# Patient Record
Sex: Male | Born: 1990
Health system: Southern US, Community
[De-identification: ages and names within clinical notes are randomized; demographics above are authoritative.]

## PROBLEM LIST (undated history)

## (undated) DIAGNOSIS — I10 Essential (primary) hypertension: Secondary | ICD-10-CM

## (undated) HISTORY — PX: WISDOM TOOTH EXTRACTION: SHX21

## (undated) HISTORY — PX: TONSILLECTOMY: SUR1361

---
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
## 2019-08-02 ENCOUNTER — Ambulatory Visit: Payer: Self-pay | Attending: Internal Medicine

## 2019-08-02 DIAGNOSIS — U071 COVID-19: Secondary | ICD-10-CM | POA: Insufficient documentation

## 2019-08-02 DIAGNOSIS — Z20822 Contact with and (suspected) exposure to covid-19: Secondary | ICD-10-CM

## 2019-08-04 ENCOUNTER — Other Ambulatory Visit: Payer: Self-pay

## 2019-08-04 LAB — NOVEL CORONAVIRUS, NAA: SARS-CoV-2, NAA: DETECTED — AB

## 2019-08-15 ENCOUNTER — Emergency Department: Payer: BC Managed Care – PPO

## 2019-08-15 ENCOUNTER — Emergency Department
Admission: EM | Admit: 2019-08-15 | Discharge: 2019-08-15 | Disposition: A | Discharge status: Home / Self Care | Payer: BC Managed Care – PPO | Attending: Emergency Medicine | Admitting: Emergency Medicine

## 2019-08-15 ENCOUNTER — Other Ambulatory Visit: Payer: Self-pay

## 2019-08-15 ENCOUNTER — Encounter: Payer: Self-pay | Admitting: Emergency Medicine

## 2019-08-15 DIAGNOSIS — R0789 Other chest pain: Secondary | ICD-10-CM | POA: Diagnosis not present

## 2019-08-15 DIAGNOSIS — R079 Chest pain, unspecified: Secondary | ICD-10-CM

## 2019-08-15 DIAGNOSIS — R000 Tachycardia, unspecified: Secondary | ICD-10-CM | POA: Diagnosis present

## 2019-08-15 DIAGNOSIS — I10 Essential (primary) hypertension: Secondary | ICD-10-CM | POA: Insufficient documentation

## 2019-08-15 DIAGNOSIS — F17210 Nicotine dependence, cigarettes, uncomplicated: Secondary | ICD-10-CM | POA: Insufficient documentation

## 2019-08-15 LAB — BASIC METABOLIC PANEL
Anion gap: 13 (ref 5–15)
BUN: 9 mg/dL (ref 6–20)
CO2: 22 mmol/L (ref 22–32)
Calcium: 9.4 mg/dL (ref 8.9–10.3)
Chloride: 100 mmol/L (ref 98–111)
Creatinine, Ser: 1.05 mg/dL (ref 0.61–1.24)
GFR calc Af Amer: >60 mL/min (ref >60)
GFR calc non Af Amer: >60 mL/min (ref >60)
Glucose, Bld: 104 mg/dL — ABNORMAL HIGH (ref 70–99)
Potassium: 3.6 mmol/L (ref 3.5–5.1)
Sodium: 135 mmol/L (ref 135–145)

## 2019-08-15 LAB — CBC
HCT: 45.6 % (ref 39.0–52.0)
Hemoglobin: 16.6 g/dL (ref 13.0–17.0)
MCH: 30.5 pg (ref 26.0–34.0)
MCHC: 36.4 g/dL — ABNORMAL HIGH (ref 30.0–36.0)
MCV: 83.7 fL (ref 80.0–100.0)
Platelets: 279 10*3/uL (ref 150–400)
RBC: 5.45 MIL/uL (ref 4.22–5.81)
RDW: 11.1 % — ABNORMAL LOW (ref 11.5–15.5)
WBC: 11.1 10*3/uL — ABNORMAL HIGH (ref 4.0–10.5)
nRBC: 0 % (ref 0.0–0.2)

## 2019-08-15 LAB — FIBRIN DERIVATIVES D-DIMER (ARMC ONLY): Fibrin derivatives D-dimer (ARMC): 240.57 ng/mL (FEU) (ref 0.00–499.00)

## 2019-08-15 LAB — TROPONIN I (HIGH SENSITIVITY)
Troponin I (High Sensitivity): <2 ng/L (ref <18)
Troponin I (High Sensitivity): <2 ng/L (ref <18)

## 2019-08-15 MED ORDER — LORAZEPAM 0.5 MG PO TABS: 0.5000 mg | ORAL_TABLET | Freq: Three times a day (TID) | ORAL | 0 refills | Status: AC | PRN

## 2019-08-15 MED ORDER — LORAZEPAM 2 MG/ML IJ SOLN
1.0000 mg | Freq: Once | INTRAMUSCULAR | Status: AC
  Administered 2019-08-15: 1 mg via INTRAVENOUS
  Filled 2019-08-15: qty 1

## 2019-08-15 MED ORDER — SODIUM CHLORIDE 0.9 % IV BOLUS
500.0000 mL | Freq: Once | INTRAVENOUS | Status: AC
  Administered 2019-08-15: 500 mL via INTRAVENOUS

## 2019-08-15 MED ORDER — LORAZEPAM 0.5 MG PO TABS: 0.5000 mg | ORAL_TABLET | Freq: Three times a day (TID) | ORAL | 0 refills | Status: DC | PRN

## 2019-08-15 MED ORDER — SODIUM CHLORIDE 0.9% FLUSH
3.0000 mL | Freq: Once | INTRAVENOUS | Status: AC
  Administered 2019-08-15: 3 mL via INTRAVENOUS

## 2019-08-15 NOTE — Discharge Instructions (Addendum)
Please seek medical attention for any high fevers, chest pain, shortness of breath, change in behavior, persistent vomiting, bloody stool or any other new or concerning symptoms.  

## 2019-08-15 NOTE — ED Triage Notes (Signed)
Pt presents to ED via POV with c/o CP that is tight in nature, Pt states tightness radiates up to his jaw. Pt noted to be tachycardic in triage with a rate of 140. Pt states CP starts in the middle then radiates to entire chest. Pt states tested positive for Covid 1/1.

## 2019-08-15 NOTE — ED Provider Notes (Signed)
[facility] Emergency Department Provider Note  ____________________________________________   I have reviewed the triage vital signs and the nursing notes.   HISTORY  Chief Complaint Tachycardia and Chest Pain   History limited by: Not Limited   HPI Kenneth Sawyer is a 29 y.o. male who presents to the emergency department today because of concern for high blood pressure and fast heart rate. The patient states that he felt like he had high blood pressure because he could feel his heart beating heavily and pulsations in his neck. The patient states that he has had some associated chest tightness with these feelings. Additionally has noticed some numbness in the arms or legs. The patient was diagnosed with covid a little over 2 weeks ago. States that he never had fevers, shortness of breath, cough or nausea with his illness. Was given a course of steroids and antibiotics which he has finished.    Records reviewed. Per medical record review patient has a history of palpitations, seen by cardiology roughly 4 years ago.   History reviewed. No pertinent past medical history.  There are no problems to display for this patient.   Past Surgical History:  Procedure Laterality Date  . TONSILLECTOMY    . WISDOM TOOTH EXTRACTION      Prior to Admission medications   Not on File    Allergies Patient has no known allergies.  No family history on file.  Social History Social History   Tobacco Use  . Smoking status: Not on file  . Smokeless tobacco: Current User  Substance Use Topics  . Alcohol use: Yes  . Drug use: Never    Review of Systems Constitutional: No fever/chills Eyes: No visual changes. ENT: No sore throat. Cardiovascular: Positive for chest tightness and fast heart rate.  Respiratory: Denies shortness of breath. Gastrointestinal: No abdominal pain.  No nausea, no vomiting.  No diarrhea.   Genitourinary: Negative for  dysuria. Musculoskeletal: Negative for back pain. Skin: Negative for rash. Neurological: Positive for extremity numbness. ____________________________________________   PHYSICAL EXAM:  VITAL SIGNS: ED Triage Vitals  Enc Vitals Group     BP 08/15/19 1922 (!) 147/113     Pulse Rate 08/15/19 1922 (!) 140     Resp 08/15/19 1922 17     Temp 08/15/19 1922 98.2 F (36.8 C)     Temp Source 08/15/19 1922 Oral     SpO2 08/15/19 1922 99 %     Weight 08/15/19 1922 240 lb (108.9 kg)     Height 08/15/19 1922 6\' 1"  (1.854 m)     Head Circumference --      Peak Flow --      Pain Score 08/15/19 1941 7   Constitutional: Alert and oriented.  Eyes: Conjunctivae are normal.  ENT      Head: Normocephalic and atraumatic.      Nose: No congestion/rhinnorhea.      Mouth/Throat: Mucous membranes are moist.      Neck: No stridor. Hematological/Lymphatic/Immunilogical: No cervical lymphadenopathy. Cardiovascular: Tachycardic, regular rhythm.  No murmurs, rubs, or gallops.  Respiratory: Normal respiratory effort without tachypnea nor retractions. Breath sounds are clear and equal bilaterally. No wheezes/rales/rhonchi. Gastrointestinal: Soft and non tender. No rebound. No guarding.  Genitourinary: Deferred Musculoskeletal: Normal range of motion in all extremities. No lower extremity edema. Neurologic:  Normal speech and language. No gross focal neurologic deficits are appreciated.  Skin:  Skin is warm, dry and intact. No rash noted. Psychiatric: Mood and affect are normal.  Speech and behavior are normal. Patient exhibits appropriate insight and judgment.  ____________________________________________    LABS (pertinent positives/negatives)  CBC wbc 11.1, hgb 16.6, plt 279 BMP wnl except glu 104 Trop hs <2 x 2 D-dimer 240.57 ____________________________________________   EKG  I, Phineas Semen, attending physician, personally viewed and interpreted this EKG  EKG Time: 1924 Rate:  128 Rhythm: sinus tachycardia Axis: normal Intervals: qtc 443 QRS: narrow ST changes: no st elevation Impression: abnormal ekg  ____________________________________________    RADIOLOGY  CXR No acute abnormality ____________________________________________   PROCEDURES  Procedures  ____________________________________________   INITIAL IMPRESSION / ASSESSMENT AND PLAN / ED COURSE  Pertinent labs & imaging results that were available during my care of the patient were reviewed by me and considered in my medical decision making (see chart for details).   Patient presented to the emergency department today because of concerns for elevated blood pressure as well as fast heart rate.  Patient's work-up here with negative troponin x2.  D-dimer was negative.  Patient did feel some improvement after Ativan.  Both blood pressure and heart rate improved.  Patient clinically felt better.  Did have a discussion with the patient.  At this point I do wonder if anxiety is playing some role in the patient's symptoms.  Will plan on discharging with prescription for Ativan.  Discussed with patient however importance of follow-up with both cardiology and primary care.   ____________________________________________   FINAL CLINICAL IMPRESSION(S) / ED DIAGNOSES  Final diagnoses:  Tachycardia  Hypertension, unspecified type  Atypical chest pain     Note: This dictation was prepared with Dragon dictation. Any transcriptional errors that result from this process are unintentional     Phineas Semen, MD 08/15/19 2344

## 2019-08-15 NOTE — ED Notes (Signed)
ED Provider at bedside. 

## 2019-08-15 NOTE — ED Notes (Signed)
Pt stating he still feels the same with little relief.

## 2020-02-28 ENCOUNTER — Other Ambulatory Visit: Payer: Self-pay | Admitting: Internal Medicine

## 2020-02-28 ENCOUNTER — Other Ambulatory Visit (HOSPITAL_COMMUNITY): Payer: Self-pay | Admitting: Internal Medicine

## 2020-02-28 DIAGNOSIS — R140 Abdominal distension (gaseous): Secondary | ICD-10-CM

## 2020-02-29 ENCOUNTER — Ambulatory Visit: Payer: 59

## 2020-03-02 ENCOUNTER — Ambulatory Visit
Admission: RE | Admit: 2020-03-02 | Discharge: 2020-03-02 | Disposition: A | Discharge status: Home / Self Care | Payer: 59 | Source: Ambulatory Visit | Attending: Internal Medicine | Admitting: Internal Medicine

## 2020-03-02 ENCOUNTER — Other Ambulatory Visit: Payer: Self-pay

## 2020-03-02 DIAGNOSIS — R140 Abdominal distension (gaseous): Secondary | ICD-10-CM

## 2020-03-02 HISTORY — DX: Essential (primary) hypertension: I10

## 2020-03-02 MED ORDER — IOHEXOL 300 MG/ML  SOLN
100.0000 mL | Freq: Once | INTRAMUSCULAR | Status: AC | PRN
  Administered 2020-03-02: 100 mL via INTRAVENOUS

## 2021-01-10 IMAGING — DX DG CHEST 1V PORT
1 series · 1 of 1 positions shown · non-contrast
Comparison: None.

CLINICAL DATA: Chest pain and tightness

EXAM:
PORTABLE CHEST 1 VIEW

[chest ap]
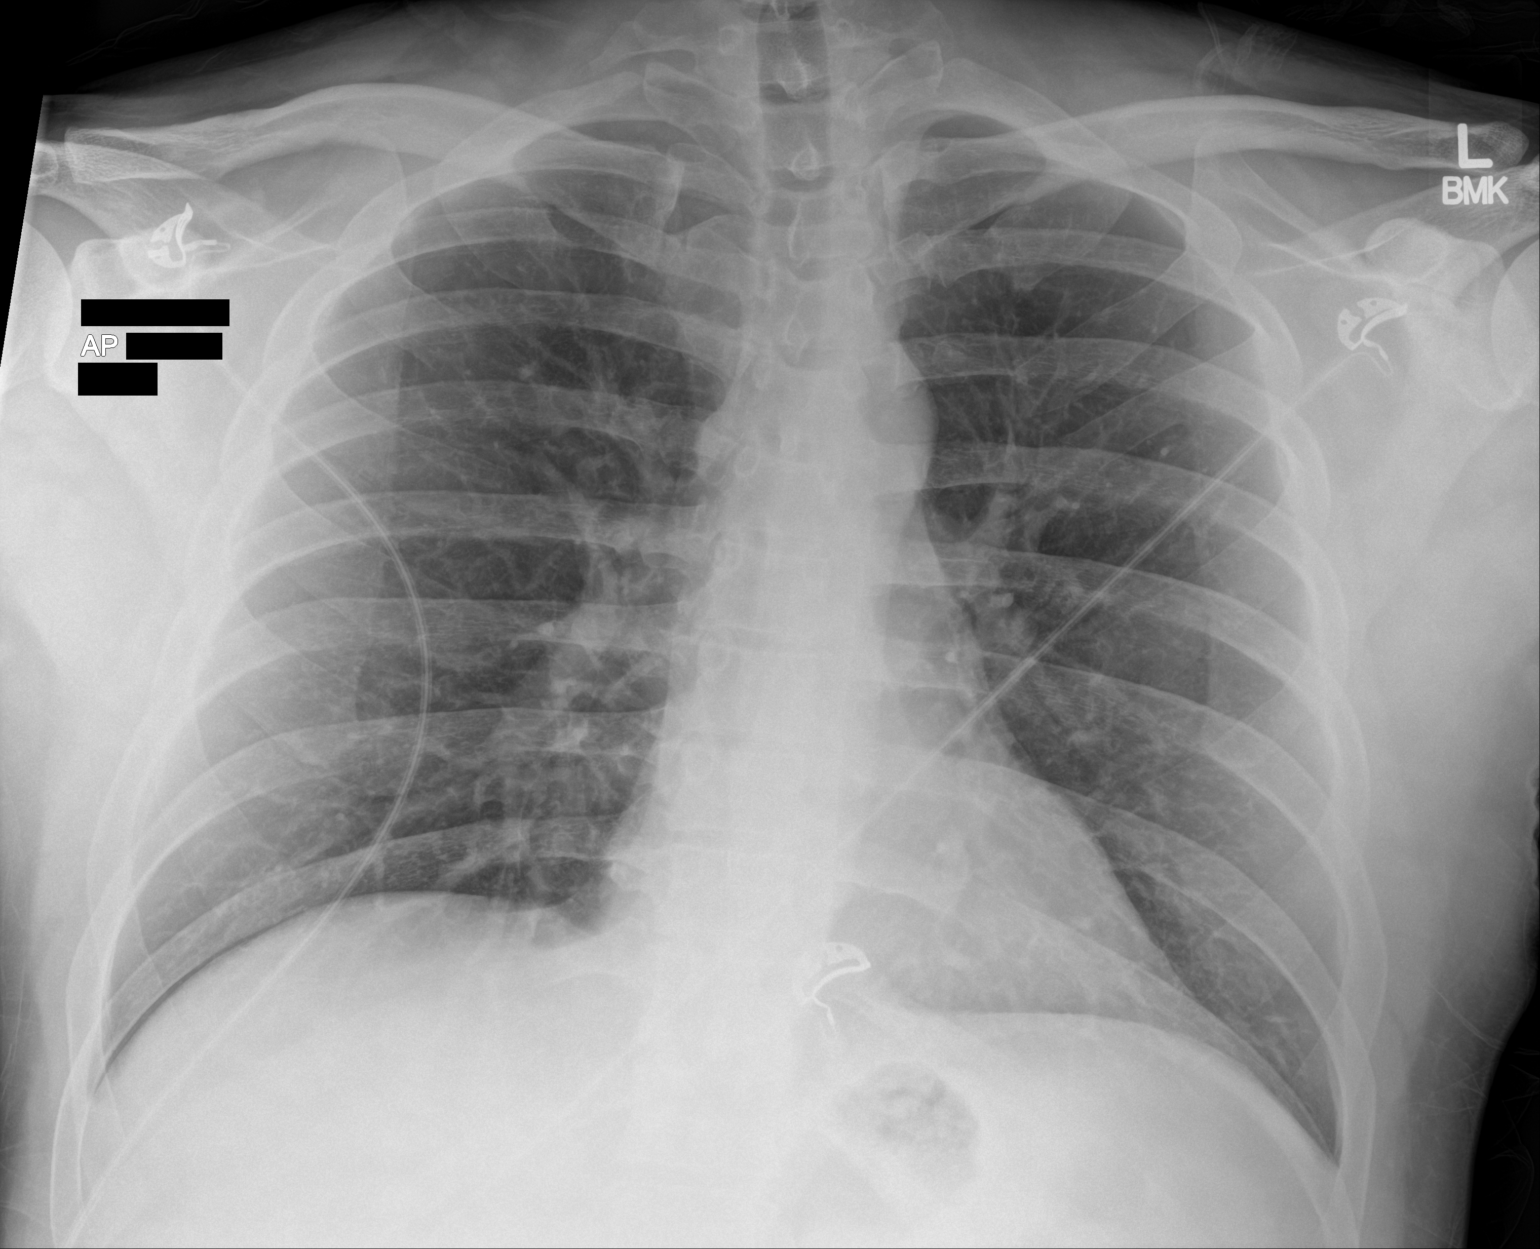

[1 of 1 positions shown; findings below may reference images not displayed]

FINDINGS: The heart size and mediastinal contours are within normal limits.
Both lungs are clear. The visualized skeletal structures are
unremarkable.
IMPRESSION: No active disease.

## 2021-01-16 ENCOUNTER — Emergency Department (HOSPITAL_COMMUNITY): Payer: 59

## 2021-01-16 ENCOUNTER — Other Ambulatory Visit: Payer: Self-pay

## 2021-01-16 ENCOUNTER — Emergency Department (HOSPITAL_COMMUNITY)
Admission: EM | Admit: 2021-01-16 | Discharge: 2021-01-16 | Disposition: A | Discharge status: Home / Self Care | Payer: 59 | Attending: Emergency Medicine | Admitting: Emergency Medicine

## 2021-01-16 ENCOUNTER — Encounter (HOSPITAL_COMMUNITY): Payer: Self-pay | Admitting: Emergency Medicine

## 2021-01-16 DIAGNOSIS — F17290 Nicotine dependence, other tobacco product, uncomplicated: Secondary | ICD-10-CM | POA: Diagnosis not present

## 2021-01-16 DIAGNOSIS — I10 Essential (primary) hypertension: Secondary | ICD-10-CM | POA: Insufficient documentation

## 2021-01-16 DIAGNOSIS — M25561 Pain in right knee: Secondary | ICD-10-CM | POA: Diagnosis present

## 2021-01-16 DIAGNOSIS — Z79899 Other long term (current) drug therapy: Secondary | ICD-10-CM | POA: Diagnosis not present

## 2021-01-16 DIAGNOSIS — L03115 Cellulitis of right lower limb: Secondary | ICD-10-CM | POA: Diagnosis not present

## 2021-01-16 LAB — BASIC METABOLIC PANEL
Anion gap: 7 (ref 5–15)
Anion gap: 8 (ref 5–15)
BUN: 11 mg/dL (ref 6–20)
BUN: 14 mg/dL (ref 6–20)
CO2: 18 mmol/L — ABNORMAL LOW (ref 22–32)
CO2: 24 mmol/L (ref 22–32)
Calcium: 6.7 mg/dL — ABNORMAL LOW (ref 8.9–10.3)
Calcium: 8.9 mg/dL (ref 8.9–10.3)
Chloride: 113 mmol/L — ABNORMAL HIGH (ref 98–111)
Chloride: 103 mmol/L (ref 98–111)
Creatinine, Ser: 0.76 mg/dL (ref 0.61–1.24)
Creatinine, Ser: 1.15 mg/dL (ref 0.61–1.24)
GFR, Estimated: >60 mL/min (ref >60)
GFR, Estimated: >60 mL/min (ref >60)
Glucose, Bld: 69 mg/dL — ABNORMAL LOW (ref 70–99)
Glucose, Bld: 96 mg/dL (ref 70–99)
Potassium: 2.6 mmol/L — CL (ref 3.5–5.1)
Potassium: 3.7 mmol/L (ref 3.5–5.1)
Sodium: 138 mmol/L (ref 135–145)
Sodium: 135 mmol/L (ref 135–145)

## 2021-01-16 LAB — CBC WITH DIFFERENTIAL/PLATELET
Abs Immature Granulocytes: 0.03 10*3/uL (ref 0.00–0.07)
Basophils Absolute: 0 10*3/uL (ref 0.0–0.1)
Basophils Relative: 0 %
Eosinophils Absolute: 0.2 10*3/uL (ref 0.0–0.5)
Eosinophils Relative: 2 %
HCT: 46.9 % (ref 39.0–52.0)
Hemoglobin: 16.6 g/dL (ref 13.0–17.0)
Immature Granulocytes: 0 %
Lymphocytes Relative: 22 %
Lymphs Abs: 1.8 10*3/uL (ref 0.7–4.0)
MCH: 31.6 pg (ref 26.0–34.0)
MCHC: 35.4 g/dL (ref 30.0–36.0)
MCV: 89.3 fL (ref 80.0–100.0)
Monocytes Absolute: 0.7 10*3/uL (ref 0.1–1.0)
Monocytes Relative: 9 %
Neutro Abs: 5.3 10*3/uL (ref 1.7–7.7)
Neutrophils Relative %: 67 %
Platelets: 270 10*3/uL (ref 150–400)
RBC: 5.25 MIL/uL (ref 4.22–5.81)
RDW: 11.5 % (ref 11.5–15.5)
WBC: 8.1 10*3/uL (ref 4.0–10.5)
nRBC: 0 % (ref 0.0–0.2)

## 2021-01-16 MED ORDER — SODIUM CHLORIDE 0.9 % IV BOLUS
1000.0000 mL | Freq: Once | INTRAVENOUS | Status: AC
  Administered 2021-01-16: 1000 mL via INTRAVENOUS

## 2021-01-16 MED ORDER — SULFAMETHOXAZOLE-TRIMETHOPRIM 800-160 MG PO TABS: 1.0000 | ORAL_TABLET | Freq: Two times a day (BID) | ORAL | 0 refills | Status: AC

## 2021-01-16 MED ORDER — SODIUM CHLORIDE 0.9 % IV SOLN
1.0000 g | Freq: Once | INTRAVENOUS | Status: AC
  Administered 2021-01-16: 1 g via INTRAVENOUS
  Filled 2021-01-16: qty 10

## 2021-01-16 MED ORDER — POTASSIUM CHLORIDE CRYS ER 20 MEQ PO TBCR
40.0000 meq | EXTENDED_RELEASE_TABLET | Freq: Once | ORAL | Status: AC
  Administered 2021-01-16: 40 meq via ORAL
  Filled 2021-01-16: qty 2

## 2021-01-16 NOTE — ED Provider Notes (Signed)
[facility] EMERGENCY DEPARTMENT Provider Note   CSN: [id number] Arrival date & time: 01/16/21  1839     History Chief Complaint  Patient presents with   Knee Pain    Kenneth Sawyer is a 30 y.o. male.  The history is provided by the patient. No language interpreter was used.  Knee Pain Location:  Knee Time since incident:  1 day Injury: no   Knee location:  R knee Pain details:    Quality:  Aching   Radiates to:  Does not radiate   Severity:  Moderate   Onset quality:  Gradual   Timing:  Constant   Progression:  Worsening Dislocation: no   Tetanus status:  Up to date Relieved by:  Nothing Worsened by:  Nothing Ineffective treatments:  None tried Associated symptoms: no fever   Pt complains of pain in his right knee.  Pt reports tonight he noticed redness.      Past Medical History:  Diagnosis Date   Hypertension     There are no problems to display for this patient.   Past Surgical History:  Procedure Laterality Date   TONSILLECTOMY     WISDOM TOOTH EXTRACTION         No family history on file.  Social History   Tobacco Use   Smoking status: Never   Smokeless tobacco: Current    Types: Chew  Substance Use Topics   Alcohol use: Yes   Drug use: Never    Home Medications Prior to Admission medications   Medication Sig Start Date End Date Taking? Authorizing Provider  LORazepam (ATIVAN) 0.5 MG tablet Take 0.5 mg by mouth 2 (two) times daily as needed. 01/14/21  Yes [provider]  metoprolol succinate (TOPROL-XL) 25 MG 24 hr tablet Take 1 tablet by mouth daily. 12/06/20  Yes [provider]  VENTOLIN HFA 108 (90 Base) MCG/ACT inhaler Inhale 2 puffs into the lungs every 4 (four) hours as needed. 07/30/19  Yes [provider]  vitamin B-12 (CYANOCOBALAMIN) 500 MCG tablet Take 500 mcg by mouth daily.   Yes [provider]  amoxicillin-clavulanate (AUGMENTIN) 875-125 MG tablet Take 1 tablet by mouth 2 (two) times  daily. For 7 days Patient not taking: Reported on 01/16/2021 07/30/19   [provider]  brompheniramine-pseudoephedrine-DM 30-2-10 MG/5ML syrup TAKE ACUTE OPIOID THERAPY ML BY MOUTH EVERY 4 HOURS AS NEEDED Patient not taking: Reported on 01/16/2021 07/30/19   [provider]  predniSONE (DELTASONE) 10 MG tablet Take 40 mg by mouth daily. Patient not taking: Reported on 01/16/2021 07/30/19   [provider]    Allergies    Escitalopram oxalate  Review of Systems   Review of Systems  Constitutional:  Negative for chills and fever.  All other systems reviewed and are negative.  Physical Exam Updated Vital Signs BP (!) 155/109   Pulse 96   Temp 98.5 F (36.9 C)   Resp 18   Ht 6\' 1"  (1.854 m)   Wt 106.6 kg   SpO2 100%   BMI 31.00 kg/m   Physical Exam Vitals and nursing note reviewed.  Constitutional:      Appearance: He is well-developed.  HENT:     Head: Normocephalic and atraumatic.  Eyes:     Conjunctiva/sclera: Conjunctivae normal.  Cardiovascular:     Rate and Rhythm: Normal rate and regular rhythm.  Pulmonary:     Effort: Pulmonary effort is normal. No respiratory distress.     Breath sounds: Normal breath  sounds.  Abdominal:     Palpations: Abdomen is soft.  Musculoskeletal:        General: Swelling and tenderness present. Normal range of motion.     Cervical back: Normal range of motion and neck supple.     Comments: 10x12 cm area of redness right knee,  no effusion,  bursa nontender no swelling.  Nv and ns intact  Skin:    General: Skin is warm and dry.  Neurological:     Mental Status: He is alert and oriented to person, place, and time.    ED Results / Procedures / Treatments   Labs (all labs ordered are listed, but only abnormal results are displayed) Labs Reviewed  BASIC METABOLIC PANEL - Abnormal; Notable for the following components:      Result Value   Potassium 2.6 (*)    Chloride 113 (*)    CO2 18 (*)    Glucose, Bld 69  (*)    Calcium 6.7 (*)    All other components within normal limits  CBC WITH DIFFERENTIAL/PLATELET  BASIC METABOLIC PANEL    EKG None  Radiology DG Knee Complete 4 Views Right  Result Date: 01/16/2021 CLINICAL DATA:  Knee pain and redness, initial encounter EXAM: RIGHT KNEE - COMPLETE 4+ VIEW COMPARISON:  None. FINDINGS: Minimal medial joint space narrowing is noted. No joint effusion is seen. Overlying soft tissue swelling is noted anteriorly. No acute bony abnormality is noted. IMPRESSION: Mild medial joint space narrowing is noted. Soft tissue swelling is noted anteriorly consistent with the given clinical history. No acute bony abnormality is seen. Electronically Signed   By: Alcide Clever M.D.   On: 01/16/2021 19:26    Procedures Procedures   Medications Ordered in ED Medications  sodium chloride 0.9 % bolus 1,000 mL (0 mLs Intravenous Stopped 01/16/21 2117)  cefTRIAXone (ROCEPHIN) 1 g in sodium chloride 0.9 % 100 mL IVPB (0 g Intravenous Stopped 01/16/21 2111)  potassium chloride SA (KLOR-CON) CR tablet 40 mEq (40 mEq Oral Given 01/16/21 2125)    ED Course  I have reviewed the triage vital signs and the nursing notes.  Pertinent labs & imaging results that were available during my care of the patient were reviewed by me and considered in my medical decision making (see chart for details).    MDM Rules/Calculators/A&P                          MDM:  Pt has a potassium of 2.6. Xray  knee, no effusion  Final Clinical Impression(s) / ED Diagnoses Final diagnoses:  Cellulitis of right leg    Rx / DC Orders ED Discharge Orders          Ordered    sulfamethoxazole-trimethoprim (BACTRIM DS) 800-160 MG tablet  2 times daily        01/16/21 2326          An After Visit Summary was printed and given to the patient.    Osie Cheeks 01/16/21 2326    Cheryll Cockayne, MD 01/25/21 2342

## 2021-01-16 NOTE — ED Notes (Signed)
Date and time results received: 01/16/21 0916 (use smartphrase ".now" to insert current time)  Test: Potassium Critical Value: 2.6  Name of Provider Notified: Keenan Bachelor  Orders Received? Or Actions Taken?: NA

## 2021-01-16 NOTE — ED Triage Notes (Signed)
Pt c/o right knee pain, redness and warm to the touch. Pt went to UC and was sent here

## 2021-01-16 NOTE — Discharge Instructions (Addendum)
Warm compresses 20 minutes 4 times a day
